# Patient Record
Sex: Male | Born: 2000
Health system: Southern US, Community
[De-identification: ages and names within clinical notes are randomized; demographics above are authoritative.]

## PROBLEM LIST (undated history)

## (undated) DIAGNOSIS — F988 Other specified behavioral and emotional disorders with onset usually occurring in childhood and adolescence: Secondary | ICD-10-CM

## (undated) HISTORY — DX: Other specified behavioral and emotional disorders with onset usually occurring in childhood and adolescence: F98.8

---
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
## 2023-07-15 ENCOUNTER — Emergency Department (HOSPITAL_COMMUNITY)
Admission: EM | Admit: 2023-07-15 | Discharge: 2023-07-15 | Disposition: A | Discharge status: Home / Self Care | Payer: BC Managed Care – PPO | Attending: Emergency Medicine | Admitting: Emergency Medicine

## 2023-07-15 ENCOUNTER — Other Ambulatory Visit: Payer: Self-pay

## 2023-07-15 DIAGNOSIS — H60552 Acute reactive otitis externa, left ear: Secondary | ICD-10-CM | POA: Diagnosis not present

## 2023-07-15 DIAGNOSIS — Z7984 Long term (current) use of oral hypoglycemic drugs: Secondary | ICD-10-CM | POA: Diagnosis not present

## 2023-07-15 DIAGNOSIS — H9202 Otalgia, left ear: Secondary | ICD-10-CM

## 2023-07-15 DIAGNOSIS — H60502 Unspecified acute noninfective otitis externa, left ear: Secondary | ICD-10-CM

## 2023-07-15 DIAGNOSIS — I10 Essential (primary) hypertension: Secondary | ICD-10-CM | POA: Insufficient documentation

## 2023-07-15 MED ORDER — OFLOXACIN 0.3 % OT SOLN: 5.0000 [drp] | Freq: Two times a day (BID) | OTIC | 0 refills | Status: AC

## 2023-07-15 NOTE — ED Triage Notes (Signed)
Pt. Stated, I have a ear ache since Sunday and unable to ear from that ear

## 2023-07-15 NOTE — Discharge Instructions (Signed)
Your history, exam, and evaluation are consistent with an occult otitis externa or inflammation/inflammation in the ear canal itself and not the eardrum.  Your left ear was not completely closed so we did not think it needed a wick placed however, it does need to get eardrops twice a day for the next week and follow-up with either your primary doctor or outpatient ears and throat doctor.  Please rest and stay hydrated.  If symptoms change or worsen acutely, return to the nearest emergency department.

## 2023-07-15 NOTE — ED Provider Notes (Signed)
[place] EMERGENCY DEPARTMENT AT [facility] Provider Note   CSN: [id number] Arrival date & time: 07/15/23  7829     History  Chief Complaint  Patient presents with   Otalgia    Gary Frank is a 22 y.o. male.  The history is provided by the patient and medical records. No language interpreter was used.  Otalgia Location:  Left Behind ear:  No abnormality Quality:  Aching and dull Severity:  Mild Onset quality:  Gradual Duration:  4 days Timing:  Constant Progression:  Unchanged Chronicity:  New Context: not direct blow, not foreign body in ear, not loud noise and not recent URI   Relieved by:  Nothing Worsened by:  Nothing Ineffective treatments:  None tried Associated symptoms: hearing loss (l ear)   Associated symptoms: no congestion, no cough, no diarrhea, no ear discharge, no fever, no headaches, no neck pain, no rash, no rhinorrhea, no sore throat and no tinnitus   Risk factors: no chronic ear infection and no prior ear surgery        Home Medications Prior to Admission medications   Medication Sig Start Date End Date Taking? Authorizing Provider  lisdexamfetamine (VYVANSE) 70 MG capsule Take 70 mg by mouth every morning.    [provider]  metFORMIN (GLUCOPHAGE) 500 MG tablet Take 1 tablet (500 mg total) by mouth 2 (two) times daily with a meal. 06/22/13   David Stall, MD      Allergies    Patient has no known allergies.    Review of Systems   Review of Systems  Constitutional:  Negative for chills, fatigue and fever.  HENT:  Positive for ear pain and hearing loss (l ear). Negative for congestion, ear discharge, facial swelling, rhinorrhea, sore throat and tinnitus.   Respiratory:  Negative for cough, chest tightness and shortness of breath.   Gastrointestinal:  Negative for diarrhea.  Musculoskeletal:  Negative for neck pain.  Skin:  Negative for rash.  Neurological:  Negative for headaches.  All other systems reviewed  and are negative.   Physical Exam Updated Vital Signs BP (!) 148/91 (BP Location: Right Arm)   Pulse (!) 59   Temp (!) 97.5 F (36.4 C)   Resp 17   SpO2 100%  Physical Exam Vitals and nursing note reviewed.  Constitutional:      General: He is not in acute distress.    Appearance: He is well-developed. He is not ill-appearing, toxic-appearing or diaphoretic.  HENT:     Head: Atraumatic.     Right Ear: Tympanic membrane, ear canal and external ear normal. There is no impacted cerumen.     Left Ear: Tympanic membrane normal. Swelling present.  No middle ear effusion. There is no impacted cerumen. Tympanic membrane is not injected, perforated, erythematous or bulging.     Ears:     Comments: Patient has swelling in the canal on the left ear with some erythema.  No purulence or drainage seen.  Appears like a left-sided otitis externa.  Could see past the swelling and the TM look normal.  Did not see perforation.    Nose: No congestion or rhinorrhea.     Mouth/Throat:     Mouth: Mucous membranes are moist.  Eyes:     Extraocular Movements: Extraocular movements intact.     Conjunctiva/sclera: Conjunctivae normal.     Pupils: Pupils are equal, round, and reactive to light.  Cardiovascular:     Rate and Rhythm: Normal rate and regular  rhythm.     Heart sounds: No murmur heard. Pulmonary:     Effort: Pulmonary effort is normal. No respiratory distress.     Breath sounds: Normal breath sounds.  Abdominal:     Palpations: Abdomen is soft.     Tenderness: There is no abdominal tenderness.  Musculoskeletal:        General: No swelling or tenderness.     Cervical back: Neck supple. No tenderness.  Skin:    General: Skin is warm and dry.     Capillary Refill: Capillary refill takes less than 2 seconds.  Neurological:     Mental Status: He is alert.  Psychiatric:        Mood and Affect: Mood normal.     ED Results / Procedures / Treatments   Labs (all labs ordered are listed,  but only abnormal results are displayed) Labs Reviewed - No data to display  EKG None  Radiology No results found.  Procedures Procedures    Medications Ordered in ED Medications - No data to display  ED Course/ Medical Decision Making/ A&P                                 Medical Decision Making   Gary Frank is a 22 y.o. male with a past medical history significant for hypertension who presents with left ear pain and decreased hearing.  Patient reports over the last few days he has had developing pain in his left ear as well as decreased hearing.  He has not had ear infection in over 10 years.  Denies any trauma.  Denies submerging his ears in water.  Does not report any Q-tip injury or foreign body.  Denies any other fevers, chills, congestion, cough, nausea, vomiting, constipation, or diarrhea.  Denies any facial or neck pain.  On exam, right ear unremarkable with intact canal and normal TM.  No otitis externa or otitis media on right ear.  Left ear revealed evidence of otitis externa with swelling in the canal with some erythema.  There was no purulence or drainage or foul smell seen.  There was no tenderness around the ear.  I could see past the swelling and the TM itself looked normal.  No evidence of perforation.  Exam otherwise remarkable with clear breath sounds.  Clinically I suspect otitis externa and patient agrees.  Will give prescription for eardrops and follow-up with his ENT and PCP.  We discussed placing a wick however as it is not occluded fully agree to hold on wick placement at this time.  No other drainage seen.  Patient will follow-up with nurse return precautions.  We discussed not using Q-tips or foreign bodies and no submersion of his ears in water.  He had no questions or concerns and was discharged in good condition.            Final Clinical Impression(s) / ED Diagnoses Final diagnoses:  Acute otitis externa of left ear, unspecified type   Left ear pain    Rx / DC Orders ED Discharge Orders          Ordered    ofloxacin (FLOXIN) 0.3 % OTIC solution  2 times daily        07/15/23 1023           Clinical Impression: 1. Acute otitis externa of left ear, unspecified type   2. Left ear pain     Disposition:  Discharge  Condition: Good  I have discussed the results, Dx and Tx plan with the pt(& family if present). He/she/they expressed understanding and agree(s) with the plan. Discharge instructions discussed at great length. Strict return precautions discussed and pt &/or family have verbalized understanding of the instructions. No further questions at time of discharge.    New Prescriptions   OFLOXACIN (FLOXIN) 0.3 % OTIC SOLUTION    Place 5 drops into the left ear 2 (two) times daily for 7 days.    Follow Up: Scarlette Ar, MD [address] [place] [address] [phone]   with Ear Nose and Throat team  Ralene Ok, MD [id number] [facility] [place] [address] [phone]         Jennaya Pogue, Canary Brim, MD 07/15/23 1031
